# Patient Record
Sex: Male | Born: 1945 | Race: White | Hispanic: No | Marital: Married
Health system: Southern US, Community
[De-identification: ages and names within clinical notes are randomized; demographics above are authoritative.]

---

## 2004-07-24 ENCOUNTER — Emergency Department (HOSPITAL_COMMUNITY): Admission: EM | Admit: 2004-07-24 | Discharge: 2004-07-24 | Payer: Self-pay | Admitting: Emergency Medicine

## 2013-05-10 ENCOUNTER — Other Ambulatory Visit: Payer: Self-pay | Admitting: Gastroenterology

## 2013-05-10 DIAGNOSIS — R1310 Dysphagia, unspecified: Secondary | ICD-10-CM

## 2013-05-15 ENCOUNTER — Ambulatory Visit
Admission: RE | Admit: 2013-05-15 | Discharge: 2013-05-15 | Disposition: A | Discharge status: Home / Self Care | Payer: Medicare Other | Source: Ambulatory Visit | Attending: Gastroenterology | Admitting: Gastroenterology

## 2013-05-15 DIAGNOSIS — R1310 Dysphagia, unspecified: Secondary | ICD-10-CM

## 2014-09-22 IMAGING — RF DG ESOPHAGUS
14 of 17 series · 19 of 24 positions shown · non-contrast
Comparison: None.

CLINICAL DATA: Dysphagia.

EXAM:
ESOPHOGRAM / BARIUM SWALLOW / BARIUM TABLET STUDY
TECHNIQUE: Combined double contrast and single contrast examination performed
using effervescent crystals, thick barium liquid, and thin barium
liquid. The patient was observed with fluoroscopy swallowing a 13mm
barium sulphate tablet.
FLUOROSCOPY TIME:  0 min 48 seconds.

[Series 1: run · 4 of 5 slices shown (1 of 14)]
[im 1/5]
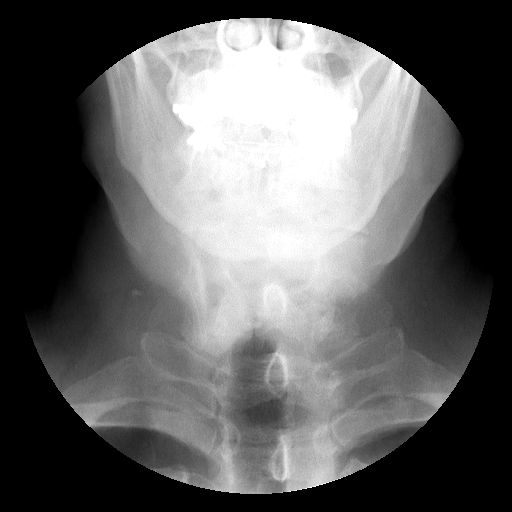
[im 2/5]
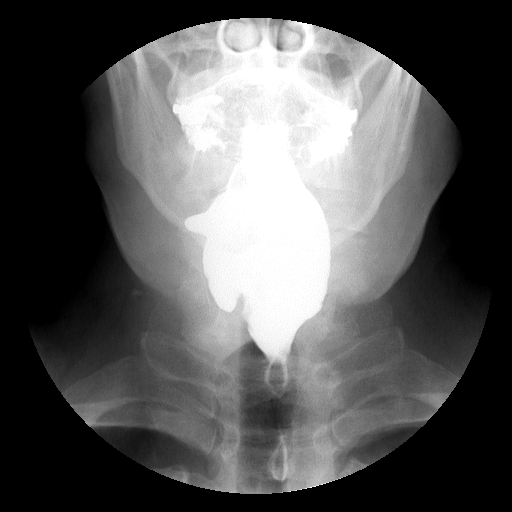
[im 4/5]
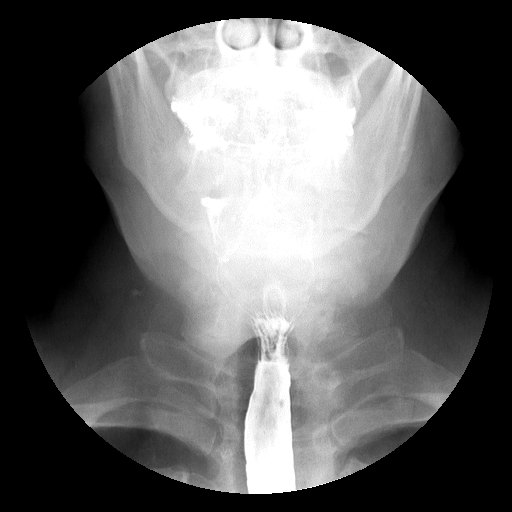
[im 5/5]
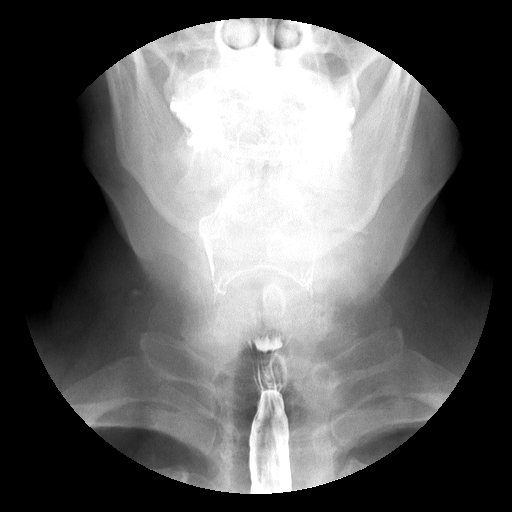

[Series 2: run · 1 of 1 slices shown (2 of 14)]
[im 1/1]
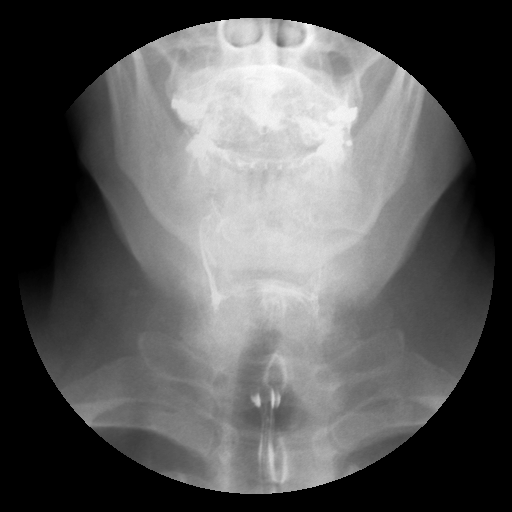

[Series 3: run · 3 of 4 slices shown (3 of 14)]
[im 1/4]
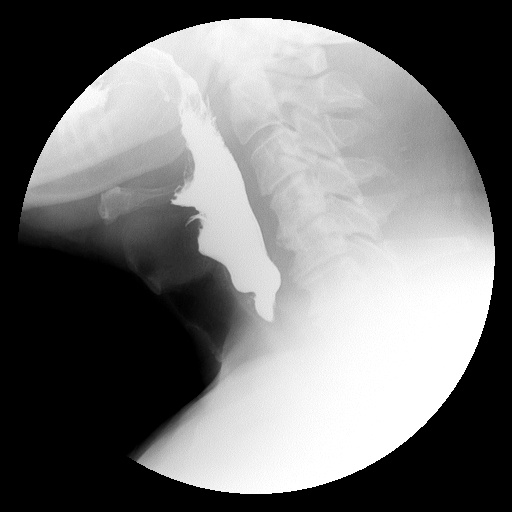
[im 3/4]
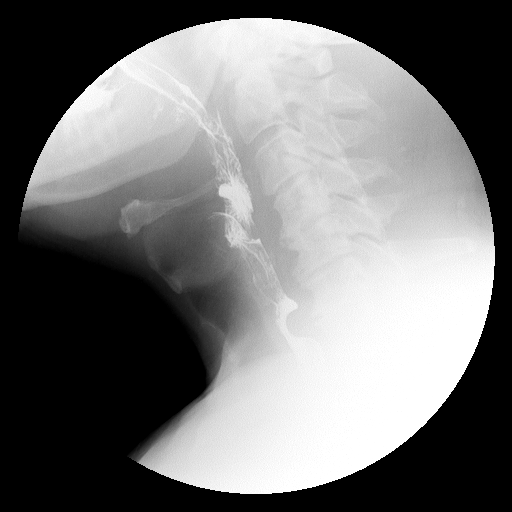
[im 4/4]
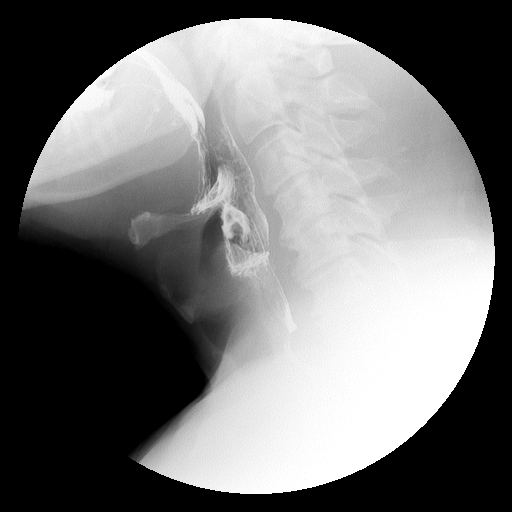

[Series 4: run · 1 of 1 slices shown (4 of 14)]
[im 1/1]
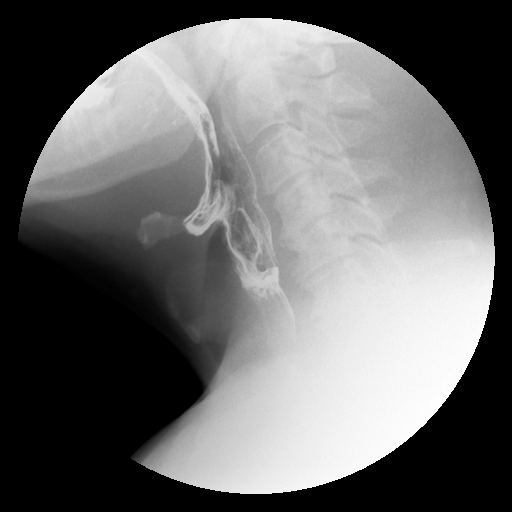

[Series 6: run · 1 of 1 slices shown (5 of 14)]
[im 1/1]
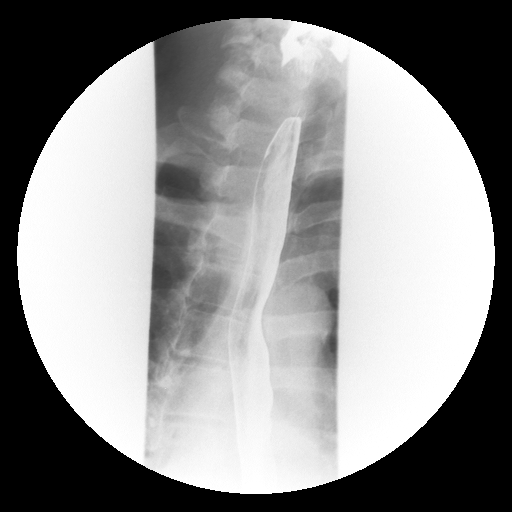

[Series 7: run · 1 of 1 slices shown (6 of 14)]
[im 1/1]
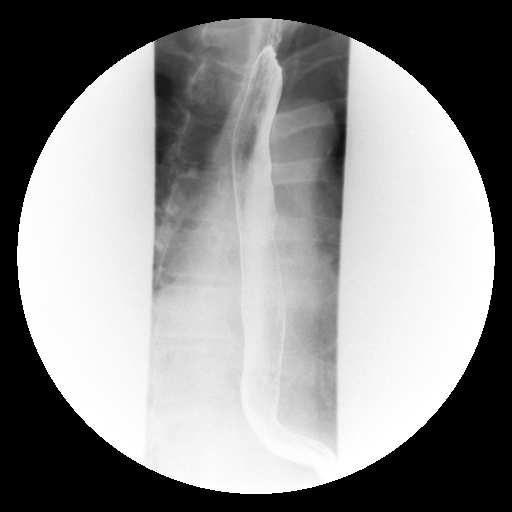

[Series 8: run · 1 of 1 slices shown (7 of 14)]
[im 1/1]
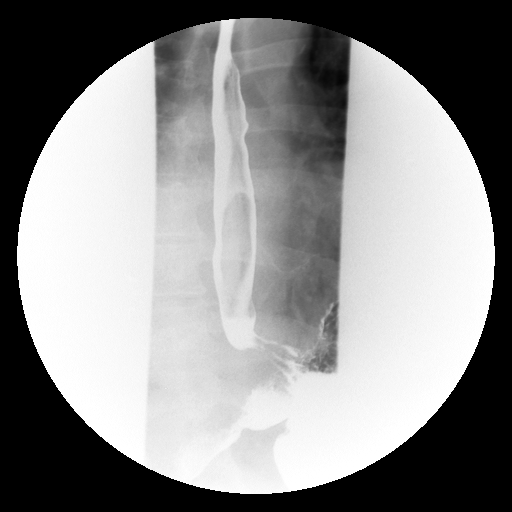

[Series 9: run · 1 of 1 slices shown (8 of 14)]
[im 1/1]
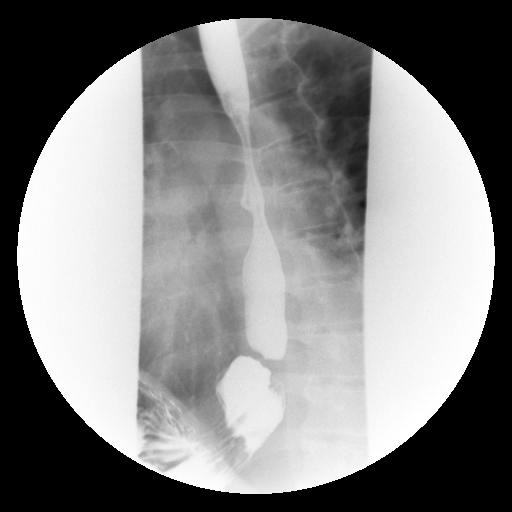

[Series 11: run · 1 of 1 slices shown (9 of 14)]
[im 1/1]
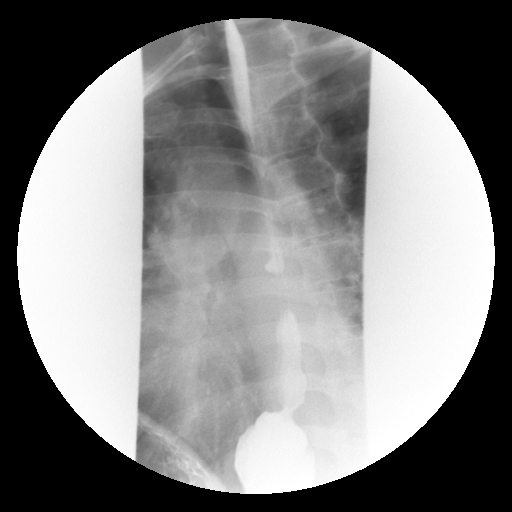

[Series 12: run · 1 of 1 slices shown (10 of 14)]
[im 1/1]
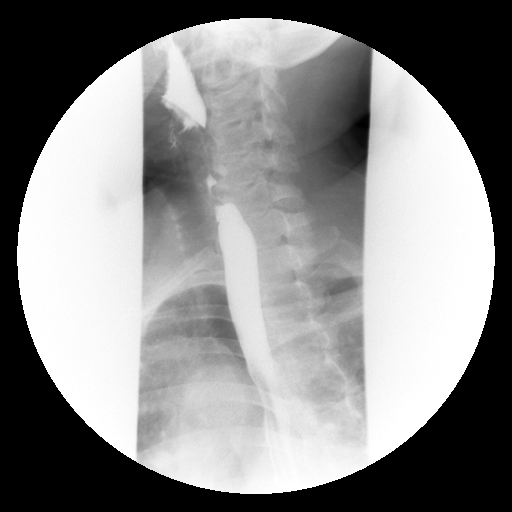

[Series 13: run · 1 of 1 slices shown (11 of 14)]
[im 1/1]
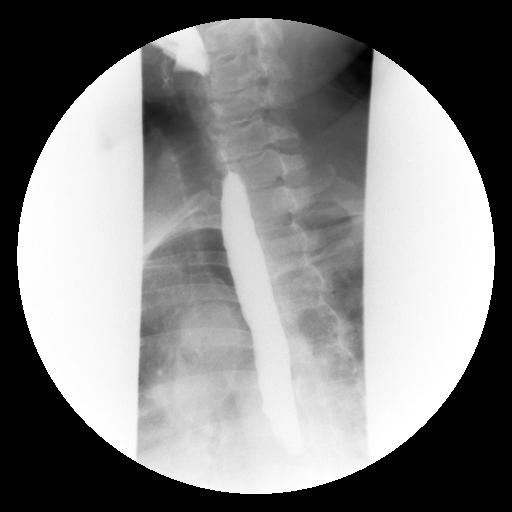

[Series 14: run · 1 of 1 slices shown (12 of 14)]
[im 1/1]
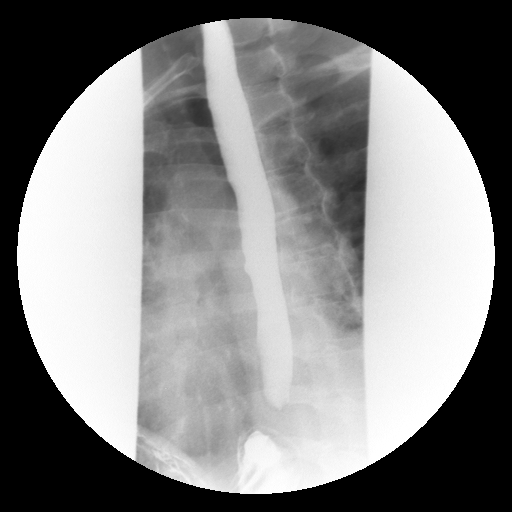

[Series 16: run · 1 of 1 slices shown (13 of 14)]
[im 1/1]
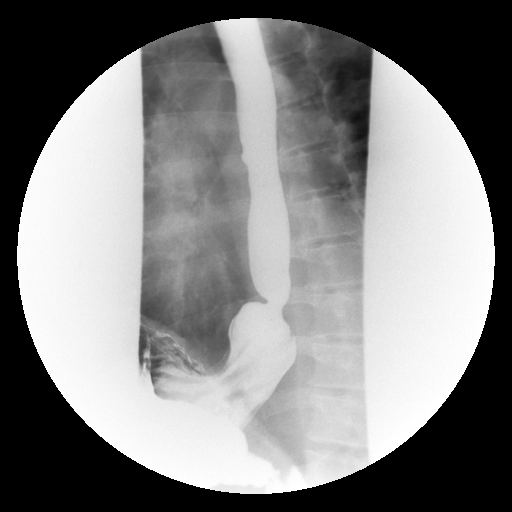

[Series 17: run · 1 of 1 slices shown (14 of 14)]
[im 1/1]
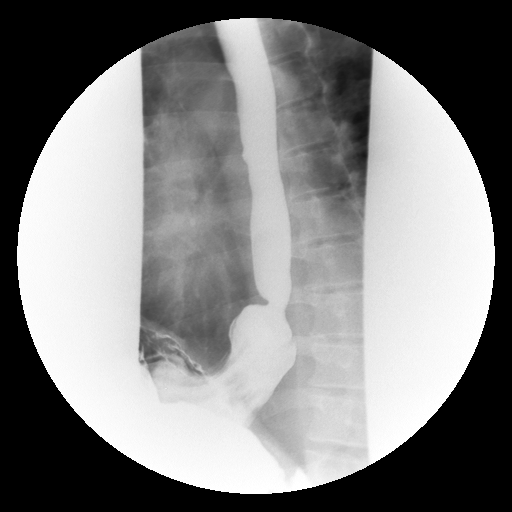

[19 of 24 positions shown; findings below may reference images not displayed]

FINDINGS: Swallowing mechanism is unremarkable. Occasional lack of primary
esophageal peristalsis. No esophageal fold thickening, stricture or
obstruction. Moderate hiatal hernia. A 13 mm barium pill passed into
the stomach without difficulty.
IMPRESSION: 1. Mild esophageal dysmotility.
2. Moderate hiatal hernia.
